# Patient Record
Sex: Male | Born: 1975 | Race: Black or African American | Hispanic: No | Marital: Single | Smoking: Never smoker
Health system: Southern US, Community
[De-identification: ages and names within clinical notes are randomized; demographics above are authoritative.]

---

## 2014-01-14 ENCOUNTER — Emergency Department (INDEPENDENT_AMBULATORY_CARE_PROVIDER_SITE_OTHER)
Admission: EM | Admit: 2014-01-14 | Discharge: 2014-01-14 | Disposition: A | Discharge status: Home / Self Care | Payer: Self-pay | Source: Home / Self Care

## 2014-01-14 ENCOUNTER — Encounter (HOSPITAL_COMMUNITY): Payer: Self-pay | Admitting: Emergency Medicine

## 2014-01-14 DIAGNOSIS — H663X2 Other chronic suppurative otitis media, left ear: Secondary | ICD-10-CM

## 2014-01-14 DIAGNOSIS — H663X9 Other chronic suppurative otitis media, unspecified ear: Secondary | ICD-10-CM

## 2014-01-14 DIAGNOSIS — H7290 Unspecified perforation of tympanic membrane, unspecified ear: Secondary | ICD-10-CM

## 2014-01-14 DIAGNOSIS — H7292 Unspecified perforation of tympanic membrane, left ear: Secondary | ICD-10-CM

## 2014-01-14 MED ORDER — CIPROFLOXACIN-DEXAMETHASONE 0.3-0.1 % OT SUSP: 4.0000 [drp] | Freq: Two times a day (BID) | OTIC | Status: DC

## 2014-01-14 NOTE — Discharge Instructions (Signed)
You have a perforated or open ear drum on the Left side.  THis will likely need surgical repair  Please go see the ENT specialists at     [address] [phone]  Pelase use the ear drops as needed for pain and infection  Consider using ibuprofen as well for pain and swelling.

## 2014-01-14 NOTE — ED Provider Notes (Signed)
CSN: [id number]     Arrival date & time 01/14/14  1749 History   None    Chief Complaint  Patient presents with  . Otalgia   (Consider location/radiation/quality/duration/timing/severity/associated sxs/prior Treatment) HPI  L earache. Ongoing off and on for 1 yr. Previously dx w/ swimmers ear w/ resolution w/ drops. Current episode started 2-3 wks ago. No change. Qtips w/o benefit. No discharge. No swimming. Echo in the L ear, but no loss of balance, HA, fevers, nausea.    History reviewed. No pertinent past medical history. History reviewed. No pertinent past surgical history. No family history on file. History  Substance Use Topics  . Smoking status: Never Smoker   . Smokeless tobacco: Not on file  . Alcohol Use: Not on file    Review of Systems Per HPI with all other pertinent systems negative.   Allergies  Review of patient's allergies indicates no known allergies.  Home Medications   Prior to Admission medications   Medication Sig Start Date End Date Taking? Authorizing Provider  ciprofloxacin-dexamethasone (CIPRODEX) otic suspension Place 4 drops into the left ear 2 (two) times daily. Treat for 14 dyas 01/14/14   Ozella Rocksavid J Vicy Medico, MD   BP 138/83  Pulse 60  Temp(Src) 98 F (36.7 C) (Oral)  Resp 18  SpO2 96% Physical Exam  Constitutional: He is oriented to person, place, and time. He appears well-developed and well-nourished. No distress.  HENT:  Head: Normocephalic.  R TM nml L TM w/ perforation along the posterior 1/3 of the eardrum w/ internal purulence and drainage present.   Eyes: EOM are normal. Pupils are equal, round, and reactive to light.  Neck: Normal range of motion. Neck supple.  Cardiovascular: Normal rate, normal heart sounds and intact distal pulses.  Exam reveals no gallop.   No murmur heard. Pulmonary/Chest: Effort normal and breath sounds normal. No respiratory distress.  Abdominal: Soft. He exhibits no distension.  Musculoskeletal: Normal  range of motion. He exhibits no edema and no tenderness.  Neurological: He is alert and oriented to person, place, and time.  Skin: Skin is warm.  Psychiatric: He has a normal mood and affect. His behavior is normal. Judgment and thought content normal.    ED Course  Procedures (including critical care time) Labs Review Labs Reviewed - No data to display  Imaging Review No results found.   MDM   1. Tympanic membrane perforation, left   2. 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 Possibly severe AOM (especially staph based), vs mechanical trauma, vs congenital (unlikely).   Ciprodex. Refills given in case they are needed prior to apt.  May need orals if not clearing Pt needs f/u w/ ENt for likely surgical repair. Referral placed and pt to call .  Shelly Flattenavid Deunte Bledsoe, MD Family Medicine 01/14/2014, 6:49 PM       Ozella Rocksavid J Rodert Hinch, MD 01/14/14 [phone]

## 2014-01-14 NOTE — ED Notes (Signed)
C/o left ear pain States feels as if ear is clogged up

## 2014-04-02 ENCOUNTER — Emergency Department (HOSPITAL_BASED_OUTPATIENT_CLINIC_OR_DEPARTMENT_OTHER)
Admission: EM | Admit: 2014-04-02 | Discharge: 2014-04-02 | Disposition: A | Discharge status: Home / Self Care | Payer: Self-pay | Attending: Emergency Medicine | Admitting: Emergency Medicine

## 2014-04-02 ENCOUNTER — Encounter (HOSPITAL_BASED_OUTPATIENT_CLINIC_OR_DEPARTMENT_OTHER): Payer: Self-pay | Admitting: Emergency Medicine

## 2014-04-02 DIAGNOSIS — Z7952 Long term (current) use of systemic steroids: Secondary | ICD-10-CM | POA: Insufficient documentation

## 2014-04-02 DIAGNOSIS — H109 Unspecified conjunctivitis: Secondary | ICD-10-CM | POA: Insufficient documentation

## 2014-04-02 MED ORDER — POLYMYXIN B-TRIMETHOPRIM 10000-0.1 UNIT/ML-% OP SOLN: 1.0000 [drp] | OPHTHALMIC | Status: DC

## 2014-04-02 MED ORDER — TETRACAINE HCL 0.5 % OP SOLN
OPHTHALMIC | Status: AC
  Filled 2014-04-02: qty 2

## 2014-04-02 MED ORDER — FLUORESCEIN SODIUM 1 MG OP STRP
ORAL_STRIP | OPHTHALMIC | Status: AC
  Filled 2014-04-02: qty 1

## 2014-04-02 MED ORDER — KETOROLAC TROMETHAMINE 0.4 % OP SOLN - NO CHARGE: 1.0000 [drp] | Freq: Four times a day (QID) | OPHTHALMIC | Status: DC | PRN

## 2014-04-02 NOTE — ED Notes (Addendum)
Pt reports possible eyes exposure to dust or chemicals at work. Pt presents with bilateral eye redness and irritation. No pertinent medical Hx. Pt denies blurred vision.

## 2014-04-02 NOTE — ED Provider Notes (Signed)
CSN: [id number]     Arrival date & time 04/02/14  1130 History   First MD Initiated Contact with Patient 04/02/14 1154     Chief Complaint  Patient presents with  . 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 Patient is not sure if this is possibly related to that. The warehouse is dusty. He does not recall any specific exposure to his eyes. Patient does not notice any change when he goes home for the day. Denies any other trouble with fever. No pain. No blurred vision. His eyes primarily red and irritated.  Patient is a 38 y.o. male presenting with eye pain. The history is provided by the patient.  Eye Pain This is a new problem. Episode onset: Several days ago. The problem occurs constantly. The problem has not changed since onset.Pertinent negatives include no chest pain, no abdominal pain, no headaches and no shortness of breath. Nothing relieves the symptoms.    History reviewed. No pertinent past medical history. History reviewed. No pertinent past surgical history. No family history on file. History  Substance Use Topics  . Smoking status: Never Smoker   . Smokeless tobacco: Not on file  . Alcohol Use: Not on file    Review of Systems  Eyes: Positive for pain.  Respiratory: Negative for shortness of breath.   Cardiovascular: Negative for chest pain.  Gastrointestinal: Negative for abdominal pain.  Neurological: Negative for headaches.      Allergies  Review of patient's allergies indicates no known allergies.  Home Medications   Prior to Admission medications   Medication Sig Start Date End Date Taking? Authorizing Provider  ciprofloxacin-dexamethasone (CIPRODEX) otic suspension Place 4 drops into the left ear 2 (two) times daily. Treat for 14 dyas 01/14/14   Ozella Rocksavid J Merrell, MD  ketorolac (ACULAR LS) 0.4 % SOLN Place 1 drop into both eyes 4 (four) times daily as needed (eye discomfort). 04/02/14    Linwood DibblesJon Fremon Zacharia, MD  trimethoprim-polymyxin b (POLYTRIM) ophthalmic solution Place 1 drop into both eyes every 4 (four) hours. 04/02/14   Linwood DibblesJon Ulrick Methot, MD   BP 126/92  Pulse 63  Temp(Src) 97.7 F (36.5 C) (Oral)  Resp 20  Ht 5\' 8"  (1.727 m)  Wt 160 lb (72.576 kg)  BMI 24.33 kg/m2  SpO2 100% Physical Exam  ED Course  Procedures (including critical care time)   MDM   Final diagnoses:  Bilateral conjunctivitis    Doubt Allergic conjunctivitis. Could be viral, less likely bacterial.  Rx acular and polytrim.   Follow up if not improving    Linwood DibblesJon Tee Richeson, MD 04/02/14 1209

## 2014-04-02 NOTE — Discharge Instructions (Signed)

## 2016-05-04 ENCOUNTER — Ambulatory Visit (HOSPITAL_COMMUNITY)
Admission: EM | Admit: 2016-05-04 | Discharge: 2016-05-04 | Disposition: A | Discharge status: Home / Self Care | Payer: Self-pay | Attending: Family Medicine | Admitting: Family Medicine

## 2016-05-04 ENCOUNTER — Encounter (HOSPITAL_COMMUNITY): Payer: Self-pay | Admitting: Family Medicine

## 2016-05-04 DIAGNOSIS — Z79899 Other long term (current) drug therapy: Secondary | ICD-10-CM | POA: Insufficient documentation

## 2016-05-04 DIAGNOSIS — Z202 Contact with and (suspected) exposure to infections with a predominantly sexual mode of transmission: Secondary | ICD-10-CM

## 2016-05-04 MED ORDER — METRONIDAZOLE 500 MG PO TABS: 2000.0000 mg | ORAL_TABLET | Freq: Once | ORAL | 0 refills | Status: AC

## 2016-05-04 NOTE — ED Provider Notes (Signed)
  [facility]    CSN: [id number] Arrival date & time: 05/04/16  1035     History   Chief Complaint Chief Complaint  Patient presents with  . Exposure to STD    HPI Cephus Slaterntoine Kallstrom is a 40 y.o. male.   This 40 year old male is here for STD evaluation.  He's had no symptoms but his girlfriend tested positive for trichomonas.   Patient works delivering plumbing supplies.      History reviewed. No pertinent past medical history.  There are no active problems to display for this patient.   History reviewed. No pertinent surgical history.     Home Medications    Prior to Admission medications   Medication Sig Start Date End Date Taking? Authorizing Provider  metroNIDAZOLE (FLAGYL) 500 MG tablet Take 4 tablets (2,000 mg total) by mouth once. 05/04/16 05/04/16  Elvina SidleKurt Tyriq Moragne, MD    Family History History reviewed. No pertinent family history.  Social History Social History  Substance Use Topics  . Smoking status: Never Smoker  . Smokeless tobacco: Never Used  . Alcohol use Not on file     Allergies   Patient has no known allergies.   Review of Systems Review of Systems  Constitutional: Negative.   HENT: Negative.   Respiratory: Negative.   Gastrointestinal: Negative.   Genitourinary: Negative.   Musculoskeletal: Negative.      Physical Exam Triage Vital Signs ED Triage Vitals [05/04/16 1104]  Enc Vitals Group     BP 131/73     Pulse Rate (!) 54     Resp 16     Temp 98 F (36.7 C)     Temp src      SpO2 99 %     Weight      Height      Head Circumference      Peak Flow      Pain Score      Pain Loc      Pain Edu?      Excl. in GC?    No data found.   Updated Vital Signs BP 131/73   Pulse (!) 54   Temp 98 F (36.7 C)   Resp 16   SpO2 99%       Physical Exam  Constitutional: He appears well-developed and well-nourished.  HENT:  Head: Normocephalic.  Genitourinary: Penis normal.  Skin: Skin is warm and dry.    Nursing note and vitals reviewed.    UC Treatments / Results  Labs (all labs ordered are listed, but only abnormal results are displayed) Labs Reviewed  URINE CYTOLOGY ANCILLARY ONLY    EKG  EKG Interpretation None       Radiology No results found.  Procedures Procedures (including critical care time)  Medications Ordered in UC Medications - No data to display   Initial Impression / Assessment and Plan / UC Course  I have reviewed the triage vital signs and the nursing notes.  Pertinent labs & imaging results that were available during my care of the patient were reviewed by me and considered in my medical decision making (see chart for details).  Clinical Course     Final Clinical Impressions(s) / UC Diagnoses   Final diagnoses:  STD exposure    New Prescriptions New Prescriptions   METRONIDAZOLE (FLAGYL) 500 MG TABLET    Take 4 tablets (2,000 mg total) by mouth once.     Elvina SidleKurt Saphia Vanderford, MD 05/04/16 1209

## 2016-05-04 NOTE — ED Triage Notes (Signed)
Pt here for possible STI. sts that his girlfriend told him she had trichomonas. Denies any symptoms.

## 2016-05-04 NOTE — Discharge Instructions (Signed)
Final lab results should be back by Friday.

## 2016-05-05 LAB — URINE CYTOLOGY ANCILLARY ONLY
Chlamydia: NEGATIVE
Neisseria Gonorrhea: NEGATIVE
Trichomonas: NEGATIVE

## 2017-07-08 ENCOUNTER — Encounter (HOSPITAL_COMMUNITY): Payer: Self-pay | Admitting: *Deleted

## 2017-07-08 ENCOUNTER — Emergency Department (HOSPITAL_COMMUNITY): Payer: 59

## 2017-07-08 ENCOUNTER — Other Ambulatory Visit: Payer: Self-pay

## 2017-07-08 DIAGNOSIS — Z79899 Other long term (current) drug therapy: Secondary | ICD-10-CM | POA: Diagnosis not present

## 2017-07-08 DIAGNOSIS — R079 Chest pain, unspecified: Secondary | ICD-10-CM | POA: Diagnosis present

## 2017-07-08 DIAGNOSIS — R072 Precordial pain: Secondary | ICD-10-CM | POA: Insufficient documentation

## 2017-07-08 LAB — CBC
HCT: 41.2 % (ref 39.0–52.0)
Hemoglobin: 13.8 g/dL (ref 13.0–17.0)
MCH: 29.8 pg (ref 26.0–34.0)
MCHC: 33.5 g/dL (ref 30.0–36.0)
MCV: 89 fL (ref 78.0–100.0)
PLATELETS: 269 10*3/uL (ref 150–400)
RBC: 4.63 MIL/uL (ref 4.22–5.81)
RDW: 13.4 % (ref 11.5–15.5)
WBC: 8.8 10*3/uL (ref 4.0–10.5)

## 2017-07-08 LAB — BASIC METABOLIC PANEL
Anion gap: 10 (ref 5–15)
BUN: 17 mg/dL (ref 6–20)
CALCIUM: 9.3 mg/dL (ref 8.9–10.3)
CO2: 26 mmol/L (ref 22–32)
Chloride: 101 mmol/L (ref 101–111)
Creatinine, Ser: 1.52 mg/dL — ABNORMAL HIGH (ref 0.61–1.24)
GFR calc Af Amer: >60 mL/min (ref >60)
GFR, EST NON AFRICAN AMERICAN: 55 mL/min — AB (ref >60)
GLUCOSE: 106 mg/dL — AB (ref 65–99)
Potassium: 3.6 mmol/L (ref 3.5–5.1)
SODIUM: 137 mmol/L (ref 135–145)

## 2017-07-08 LAB — I-STAT TROPONIN, ED: TROPONIN I, POC: 0.01 ng/mL (ref 0.00–0.08)

## 2017-07-08 NOTE — ED Triage Notes (Signed)
Pt c/o chest tightness for the past two days, reports increased sob when sitting still. Had cold like symptoms with productive cough for the past several days, has been taking mucinex without relief. Also reports lifting weights, but not more than usual

## 2017-07-09 ENCOUNTER — Emergency Department (HOSPITAL_COMMUNITY)
Admission: EM | Admit: 2017-07-09 | Discharge: 2017-07-09 | Disposition: A | Discharge status: Home / Self Care | Payer: 59 | Attending: Emergency Medicine | Admitting: Emergency Medicine

## 2017-07-09 DIAGNOSIS — R072 Precordial pain: Secondary | ICD-10-CM

## 2017-07-09 MED ORDER — IBUPROFEN 600 MG PO TABS: 600.0000 mg | ORAL_TABLET | Freq: Three times a day (TID) | ORAL | 0 refills | Status: DC

## 2017-07-09 MED ORDER — IBUPROFEN 800 MG PO TABS
800.0000 mg | ORAL_TABLET | Freq: Once | ORAL | Status: AC
  Administered 2017-07-09: 800 mg via ORAL
  Filled 2017-07-09: qty 1

## 2017-07-09 NOTE — Discharge Instructions (Signed)

## 2017-07-09 NOTE — ED Provider Notes (Signed)
[facility] EMERGENCY DEPARTMENT Provider Note   CSN: [id number] Arrival date & time: 07/08/17  2228     History   Chief Complaint Chief Complaint  Patient presents with  . Chest Pain    HPI John Barry is a 42 y.o. male.  The history is provided by the patient.  Chest Pain   This is a new problem. The current episode started more than 2 days ago. The problem occurs constantly. The problem has been gradually worsening. Associated with: Certain positions. The pain is present in the substernal region. The pain is moderate. The quality of the pain is described as pressure-like. The pain does not radiate. The symptoms are aggravated by certain positions. Associated symptoms include cough and shortness of breath. Pertinent negatives include no abdominal pain, no back pain, no dizziness, no fever, no hemoptysis, no lower extremity edema, no near-syncope, no syncope and no vomiting. Treatments tried: Mucinex. There are no known risk factors.  Pertinent negatives for past medical history include no CAD and no PE.   Patient reports gradual onset of chest pressure about a week ago It seems to be worse with lying flat, improves with sitting up No GERD symptoms reported No fever/vomiting He reports minimal shortness of breath. He reports recent cough and/head cold, has been taking Mucinex He reports he does work out, but reports his pain actually improves with exercise His chest pain is not exertional There is no pleuritic pain    PMH-none Soc hx - nonsmoker, no drug use Home Medications    Prior to Admission medications   Medication Sig Start Date End Date Taking? Authorizing Provider  guaiFENesin (MUCINEX) 600 MG 12 hr tablet Take 600 mg by mouth 2 (two) times daily as needed for cough or to loosen phlegm.   Yes [provider]    Family History No family history on file.  Social History Social History   Tobacco Use  . Smoking status: Never  Smoker  . Smokeless tobacco: Never Used  Substance Use Topics  . Alcohol use: Yes    Frequency: Never  . Drug use: No     Allergies   Patient has no known allergies.   Review of Systems Review of Systems  Constitutional: Negative for fever.  Respiratory: Positive for cough and shortness of breath. Negative for hemoptysis.   Cardiovascular: Positive for chest pain. Negative for leg swelling, syncope and near-syncope.  Gastrointestinal: Negative for abdominal pain and vomiting.  Musculoskeletal: Negative for back pain.  Neurological: Negative for dizziness and syncope.  All other systems reviewed and are negative.    Physical Exam Updated Vital Signs BP (!) 156/88   Pulse (!) 59   Temp 98.1 F (36.7 C)   Resp 16   SpO2 99%   Physical Exam CONSTITUTIONAL: Well developed/well nourished HEAD: Normocephalic/atraumatic EYES: EOMI/PERRL ENMT: Mucous membranes moist NECK: supple no meningeal signs SPINE/BACK:entire spine nontender CV: S1/S2 noted, no murmurs/rubs/gallops noted LUNGS: Lungs are clear to auscultation bilaterally, no apparent distress Chest -no tenderness noted to chest wall  ABDOMEN: soft, nontender, no rebound or guarding, bowel sounds noted throughout abdomen GU:no cva tenderness NEURO: Pt is awake/alert/appropriate, moves all extremitiesx4.  No facial droop.   EXTREMITIES: pulses normal/equal, full ROM, no lower extremity edema or tenderness SKIN: warm, color normal PSYCH: no abnormalities of mood noted, alert and oriented to situation   ED Treatments / Results  Labs (all labs ordered are listed, but only abnormal results are displayed) Labs Reviewed  BASIC  METABOLIC PANEL - Abnormal; Notable for the following components:      Result Value   Glucose, Bld 106 (*)    Creatinine, Ser 1.52 (*)    GFR calc non Af Amer 55 (*)    All other components within normal limits  CBC  I-STAT TROPONIN, ED    EKG  EKG Interpretation  Date/Time:  Saturday  July 08 2017 22:33:03 EST Ventricular Rate:  71 PR Interval:  142 QRS Duration: 94 QT Interval:  410 QTC Calculation: 445 R Axis:   92 Text Interpretation:  Normal sinus rhythm Rightward axis Borderline ECG No previous ECGs available Confirmed by Zadie RhineWickline, Cesilia Shinn ([id number]) on 07/09/2017 12:48:40 AM       Radiology Dg Chest 2 View  Result Date: 07/08/2017 CLINICAL DATA:  Chest pain for 2 days. EXAM: CHEST  2 VIEW COMPARISON:  None. FINDINGS: The cardiac silhouette, mediastinal and hilar contours are normal. The lungs are clear. No pleural effusion. The bony thorax is intact. IMPRESSION: No acute cardiopulmonary findings and intact bony thorax. Electronically Signed   By: Rudie MeyerP.  Gallerani M.D.   On: 07/08/2017 23:12    Procedures Procedures    Medications Ordered in ED Medications  ibuprofen (ADVIL,MOTRIN) tablet 800 mg (800 mg Oral Given 07/09/17 0133)     Initial Impression / Assessment and Plan / ED Course  I have reviewed the triage vital signs and the nursing notes.  Pertinent labs & imaging results that were available during my care of the patient were reviewed by me and considered in my medical decision making (see chart for details).     2:05 AM Patient with chest pain for the past several days.  It only seems to be worse when he is lying back but improved with sitting forward it is not pleuritic or sharp, is not exertional He appears to be PERC negative Troponin negative, my suspicion for ACS is low Pericarditis is a possibility, though no convincing signs on EKG I feel it is reasonable to discharge home, ibuprofen 3 times daily for a week, increase p.o. fluids as he is dehydrated, and follow-up with cardiology if persist after a week.  Also advised need to limit exertion over the next week until seen by cardiology or his symptoms are resolved  Final Clinical Impressions(s) / ED Diagnoses   Final diagnoses:  Precordial pain    ED Discharge Orders        Ordered     ibuprofen (ADVIL,MOTRIN) 600 MG tablet  3 times daily     07/09/17 0147       Zadie RhineWickline, Amaliya Whitelaw, MD 07/09/17 0206

## 2017-07-14 ENCOUNTER — Encounter: Payer: Self-pay | Admitting: Physician Assistant

## 2017-07-14 ENCOUNTER — Ambulatory Visit: Payer: 59 | Admitting: Physician Assistant

## 2017-07-14 ENCOUNTER — Other Ambulatory Visit: Payer: Self-pay

## 2017-07-14 VITALS — BP 138/89 | HR 56 | Temp 98.3°F | Resp 16 | Ht 68.0 in | Wt 179.0 lb

## 2017-07-14 DIAGNOSIS — R0789 Other chest pain: Secondary | ICD-10-CM

## 2017-07-14 DIAGNOSIS — J320 Chronic maxillary sinusitis: Secondary | ICD-10-CM | POA: Diagnosis not present

## 2017-07-14 MED ORDER — AMOXICILLIN-POT CLAVULANATE 875-125 MG PO TABS: 1.0000 | ORAL_TABLET | Freq: Two times a day (BID) | ORAL | 0 refills | Status: DC

## 2017-07-14 NOTE — Patient Instructions (Addendum)
I am treating you today for a sinus infection. However, your symptoms could be due to reflux. If you are not better after your antibiotics, come back and we will treat you for reflux.   Consider sleeping with a humidifier in your room. Try a neti pot or nasal saline spray to rinse your nasal passages.  Try elevating the head of your bed, or sleep on a few pillows. See if this helps.  We can also consider anxiety. Please see below for tips. There are options for anxiety treatment, however it is always great to have stress relieving techniques like deep breathing, stretching, etc.    Come back and see me in 3-4 weeks to recheck your symptoms.   NUTRITION Eat more plants: colorful vegetables, nuts, seeds and berries.  Eat less sugar, salt, preservatives and processed foods.  Avoid toxins such as cigarettes and alcohol.  Drink water when you are thirsty. Warm water with a slice of lemon is an excellent morning drink to start the day.  Consider these websites for more information The Nutrition Source (https://www.henry-hernandez.biz/) Precision Nutrition (WindowBlog.ch)   RELAXATION Consider practicing mindfulness meditation or other relaxation techniques such as deep breathing, prayer, yoga, tai chi, massage. See website mindful.org or the apps Headspace or Calm to help get started.   SLEEP Try to get at least 7-8+ hours sleep per day. Regular exercise and reduced caffeine will help you sleep better. Practice good sleep hygeine techniques. See website sleep.org for more information.   PLANNING Prepare estate planning, living will, healthcare POA documents. Sometimes this is best planned with the help of an attorney. Theconversationproject.org and agingwithdignity.org are excellent resources.  Thank you for coming in today. I hope you feel we met your needs.  Feel free to call PCP if you have any questions or further requests.  Please  consider signing up for MyChart if you do not already have it, as this is a great way to communicate with me.  Best,  Whitney McVey, PA-C  IF you received an x-ray today, you will receive an invoice from [facility]. Please contact [facility] at [phone] with questions or concerns regarding your invoice.   IF you received labwork today, you will receive an invoice from [place]. Please contact [facility] at [phone] with questions or concerns regarding your invoice.   Our billing staff will not be able to assist you with questions regarding bills from these companies.  You will be contacted with the lab results as soon as they are available. The fastest way to get your results is to activate your My Chart account. Instructions are located on the last page of this paperwork. If you have not heard from [place] regarding the results in 2 weeks, please contact this office.

## 2017-07-14 NOTE — Progress Notes (Signed)
John Barry  MRN: [id number] DOB: 09/22/1975  PCP: Patient, No Pcp Per  Subjective:  Pt is a 42 year old male who presents to clinic for cough x 2 weeks.  Endorses feeling of chest tightness when he lays down.   Seen in [place] 07/09/2017 for the same c/c. Normal sinus rhythm Rightward axis. Negative cardiac workup. Treated with ibuprofen for possible pericarditis.  Coughs occasionally during the day. Worse at night. Denies symptoms of reflux.  Mucinex x 1 week - not helping.  He was taking zantac twice daily. Helped a little bit. Theraflu - did not help.  H/o allergies.   Of note, pt's sister was shot and killed 2 years ago. He admits to sadness and anxiety over this. Denies SI or HI.    Review of Systems  Constitutional: Negative for chills and diaphoresis.  HENT: Positive for congestion, rhinorrhea, sinus pressure and sinus pain.   Respiratory: Positive for cough. Negative for chest tightness, shortness of breath and wheezing.   Cardiovascular: Negative for chest pain, palpitations and leg swelling.  Gastrointestinal: Negative for diarrhea, nausea and vomiting.  Musculoskeletal: Negative for neck pain.  Neurological: Negative for dizziness, syncope, light-headedness and headaches.  Psychiatric/Behavioral: Positive for sleep disturbance. The patient is not nervous/anxious.     There are no active problems to display for this patient.   Current Outpatient Medications on File Prior to Visit  Medication Sig Dispense Refill  . ibuprofen (ADVIL,MOTRIN) 600 MG tablet Take 1 tablet (600 mg total) by mouth 3 (three) times daily. 21 tablet 0  . guaiFENesin (MUCINEX) 600 MG 12 hr tablet Take 600 mg by mouth 2 (two) times daily as needed for cough or to loosen phlegm.     No current facility-administered medications on file prior to visit.     No Known Allergies   Objective:  BP 138/89   Pulse (!) 56   Temp 98.3 F (36.8 C) (Oral)   Resp 16   Ht 5\' 8"  (1.727 m)   Wt 179  lb (81.2 kg)   SpO2 96%   BMI 27.22 kg/m   Physical Exam  Constitutional: He is oriented to person, place, and time and well-developed, well-nourished, and in no distress. No distress.  HENT:  Right Ear: Tympanic membrane normal.  Left Ear: Tympanic membrane normal.  Nose: Mucosal edema present. No rhinorrhea. Right sinus exhibits frontal sinus tenderness. Right sinus exhibits no maxillary sinus tenderness. Left sinus exhibits frontal sinus tenderness. Left sinus exhibits no maxillary sinus tenderness.  Mouth/Throat: Oropharynx is clear and moist and mucous membranes are normal.  Cardiovascular: Normal rate, regular rhythm and normal heart sounds.  Pulmonary/Chest: Effort normal and breath sounds normal. No respiratory distress. He has no wheezes. He has no rales.  Neurological: He is alert and oriented to person, place, and time. GCS score is 15.  Skin: Skin is warm and dry.  Psychiatric: Mood, memory, affect and judgment normal.  Vitals reviewed.   07/08/2017 - Chest x-ray negative.  Assessment and Plan :  1. Maxillary sinusitis, unspecified chronicity - amoxicillin-clavulanate (AUGMENTIN) 875-125 MG tablet; Take 1 tablet by mouth 2 (two) times daily.  Dispense: 14 tablet; Refill: 0  2. Chest tightness - Check Peak Flow - H. pylori breath test - Pt c/o cough and chest tightness > 2 weeks. Suspect sinusitis vs anxiety vs GERD. Peak flow today is 440 "I felt like phlegm was trying to come up when I blew". Advised nasal rinses, humidifier in his room at night.  Encouraged pt to consider his anxiety surrounding his sisters' death two years ago. RTC in 3-4 weeks to recheck symptoms. Consider GERD treatment.   Marco CollieWhitney Burnham Trost, PA-C  [facility] at [facility] 07/14/2017 3:47 PM

## 2017-07-15 LAB — H. PYLORI BREATH TEST: H pylori Breath Test: NEGATIVE

## 2017-07-17 ENCOUNTER — Encounter: Payer: Self-pay | Admitting: Physician Assistant

## 2017-08-04 ENCOUNTER — Ambulatory Visit: Payer: 59 | Admitting: Physician Assistant

## 2017-08-04 ENCOUNTER — Encounter: Payer: Self-pay | Admitting: Physician Assistant

## 2017-08-04 ENCOUNTER — Other Ambulatory Visit: Payer: Self-pay

## 2017-08-04 ENCOUNTER — Ambulatory Visit (INDEPENDENT_AMBULATORY_CARE_PROVIDER_SITE_OTHER): Payer: 59 | Admitting: Physician Assistant

## 2017-08-04 VITALS — BP 110/86 | HR 57 | Temp 98.2°F | Resp 16 | Ht 67.0 in | Wt 174.0 lb

## 2017-08-04 DIAGNOSIS — R0981 Nasal congestion: Secondary | ICD-10-CM

## 2017-08-04 DIAGNOSIS — K219 Gastro-esophageal reflux disease without esophagitis: Secondary | ICD-10-CM

## 2017-08-04 DIAGNOSIS — R0789 Other chest pain: Secondary | ICD-10-CM

## 2017-08-04 MED ORDER — PANTOPRAZOLE SODIUM 20 MG PO TBEC: 20.0000 mg | DELAYED_RELEASE_TABLET | Freq: Every day | ORAL | 1 refills | Status: AC

## 2017-08-04 MED ORDER — FLUTICASONE PROPIONATE 50 MCG/ACT NA SUSP: 2.0000 | Freq: Every day | NASAL | 6 refills | Status: AC

## 2017-08-04 MED ORDER — OMEPRAZOLE 20 MG PO CPDR: 20.0000 mg | DELAYED_RELEASE_CAPSULE | Freq: Every day | ORAL | 1 refills | Status: DC

## 2017-08-04 NOTE — Patient Instructions (Addendum)
Throw your Afrin in the trash!! Do not use any nasal spray for 5-7 days.  Start using flonse: 2 sprays each nostril daily as needed for nasal congestion  Prilosec is for reflux. Take this EVERY MORNING 30 minutes before the first meal of the day.  You can elevate the head of your bed 6 inches to alleviate nighttime symptoms.  Come back and see me in 3-4 weeks.   Thank you for coming in today. I hope you feel we met your needs.  Feel free to call PCP if you have any questions or further requests.  Please consider signing up for MyChart if you do not already have it, as this is a great way to communicate with me.  Best,  Whitney McVey, PA-C   Food Choices for Gastroesophageal Reflux Disease, Adult When you have gastroesophageal reflux disease (GERD), the foods you eat and your eating habits are very important. Choosing the right foods can help ease your discomfort. What guidelines do I need to follow?  Choose fruits, vegetables, whole grains, and low-fat dairy products.  Choose low-fat meat, fish, and poultry.  Limit fats such as oils, salad dressings, butter, nuts, and avocado.  Keep a food diary. This helps you identify foods that cause symptoms.  Avoid foods that cause symptoms. These may be different for everyone.  Eat small meals often instead of 3 large meals a day.  Eat your meals slowly, in a place where you are relaxed.  Limit fried foods.  Cook foods using methods other than frying.  Avoid drinking alcohol.  Avoid drinking large amounts of liquids with your meals.  Avoid bending over or lying down until 2-3 hours after eating. What foods are not recommended? These are some foods and drinks that may make your symptoms worse: Vegetables Tomatoes. Tomato juice. Tomato and spaghetti sauce. Chili peppers. Onion and garlic. Horseradish. Fruits Oranges, grapefruit, and lemon (fruit and juice). Meats High-fat meats, fish, and poultry. This includes hot dogs, ribs,  ham, sausage, salami, and bacon. Dairy Whole milk and chocolate milk. Sour cream. Cream. Butter. Ice cream. Cream cheese. Drinks Coffee and tea. Bubbly (carbonated) drinks or energy drinks. Condiments Hot sauce. Barbecue sauce. Sweets/Desserts Chocolate and cocoa. Donuts. Peppermint and spearmint. Fats and Oils High-fat foods. This includes [place] fries and potato chips. Other Vinegar. Strong spices. This includes black pepper, white pepper, red pepper, cayenne, curry powder, cloves, ginger, and chili powder. The items listed above may not be a complete list of foods and drinks to avoid. Contact your dietitian for more information. This information is not intended to replace advice given to you by your health care provider. Make sure you discuss any questions you have with your health care provider. Document Released: 12/13/2011 Document Revised: 11/19/2015 Document Reviewed: 04/17/2013 Elsevier Interactive Patient Education  2017 [employer].  IF you received an x-ray today, you will receive an invoice from [facility]. Please contact [facility] at [phone] with questions or concerns regarding your invoice.   IF you received labwork today, you will receive an invoice from [place]. Please contact [facility] at [phone] with questions or concerns regarding your invoice.   Our billing staff will not be able to assist you with questions regarding bills from these companies.  You will be contacted with the lab results as soon as they are available. The fastest way to get your results is to activate your My Chart account. Instructions are located on the last page of this paperwork. If you have not heard  from [place] regarding the results in 2 weeks, please contact this office.

## 2017-08-04 NOTE — Progress Notes (Signed)
   John Barry  MRN: [id number] DOB: Jul 20, 1975  PCP: Patient, No Pcp Per  Subjective:  Pt is a pleasant 42 year old male who presents to clinic for f/u chest tightness x three weeks.  "feels like mucus is there".  He uses Afrin on a regular basis for nasal congestion.  Coughs occasionally during the day. Worse at night. Denies symptoms of reflux.  Mucinex x 1 week - not helping.  He was taking zantac twice daily. Helped a little bit. He was treated for sinusitis on 1/18 with augmentin - this did not help his symptoms very much. At that OV "Suspect sinusitis vs anxiety vs GERD."  Of note, pt's sister was shot and killed 2 years ago. He admits to sadness and anxiety over this. Denies SI or HI.   Review of Systems  Constitutional: Negative for chills, diaphoresis and fatigue.  HENT: Positive for congestion.   Respiratory: Positive for cough and chest tightness. Negative for shortness of breath and wheezing.   Cardiovascular: Negative for chest pain, palpitations and leg swelling.    There are no active problems to display for this patient.   No current outpatient medications on file prior to visit.   No current facility-administered medications on file prior to visit.     No Known Allergies   Objective:  BP 110/86   Pulse (!) 57   Temp 98.2 F (36.8 C) (Oral)   Resp 16   Ht 5\' 7"  (1.702 m)   Wt 174 lb (78.9 kg)   SpO2 98%   BMI 27.25 kg/m   Physical Exam  Constitutional: He is oriented to person, place, and time and well-developed, well-nourished, and in no distress. No distress.  Cardiovascular: Normal rate, regular rhythm and normal heart sounds.  Pulmonary/Chest: Effort normal and breath sounds normal. No respiratory distress. He has no wheezes.  Neurological: He is alert and oriented to person, place, and time. GCS score is 15.  Skin: Skin is warm and dry.  Psychiatric: Mood, memory, affect and judgment normal.  Vitals reviewed.    Assessment and Plan :    1. Gastroesophageal reflux disease, esophagitis presence not specified 2. Atypical chest pain - pantoprazole (PROTONIX) 20 MG tablet; Take 1 tablet (20 mg total) by mouth daily.  Dispense: 30 tablet; Refill: 1 - pt presents with con't chest congestion and tightness. He was treated for sinusitis 1/18 - this did not help symptoms. Negative H Pylori. Plan to treat for GERD at this time. Advised raising head of bed 6". RTC in 4 weeks for recheck. Recheck kidney function at next OV.  3. Nasal congestion - fluticasone (FLONASE) 50 MCG/ACT nasal spray; Place 2 sprays into both nostrils daily.  Dispense: 16 g; Refill: 6 - advised pt to d/c Afrin use.   John CollieWhitney Hilja Kintzel, PA-C  Primary Care at [facility] 08/04/2017 6:22 PM

## 2017-08-23 ENCOUNTER — Ambulatory Visit: Payer: 59 | Admitting: Physician Assistant

## 2018-09-04 ENCOUNTER — Emergency Department (HOSPITAL_BASED_OUTPATIENT_CLINIC_OR_DEPARTMENT_OTHER): Payer: 59

## 2018-09-04 ENCOUNTER — Emergency Department (HOSPITAL_BASED_OUTPATIENT_CLINIC_OR_DEPARTMENT_OTHER)
Admission: EM | Admit: 2018-09-04 | Discharge: 2018-09-04 | Disposition: A | Discharge status: Home / Self Care | Payer: 59 | Attending: Emergency Medicine | Admitting: Emergency Medicine

## 2018-09-04 ENCOUNTER — Encounter (HOSPITAL_BASED_OUTPATIENT_CLINIC_OR_DEPARTMENT_OTHER): Payer: Self-pay | Admitting: *Deleted

## 2018-09-04 ENCOUNTER — Other Ambulatory Visit: Payer: Self-pay

## 2018-09-04 DIAGNOSIS — R109 Unspecified abdominal pain: Secondary | ICD-10-CM

## 2018-09-04 DIAGNOSIS — R509 Fever, unspecified: Secondary | ICD-10-CM | POA: Insufficient documentation

## 2018-09-04 DIAGNOSIS — N200 Calculus of kidney: Secondary | ICD-10-CM | POA: Insufficient documentation

## 2018-09-04 DIAGNOSIS — R112 Nausea with vomiting, unspecified: Secondary | ICD-10-CM | POA: Insufficient documentation

## 2018-09-04 DIAGNOSIS — Z79899 Other long term (current) drug therapy: Secondary | ICD-10-CM | POA: Insufficient documentation

## 2018-09-04 LAB — URINALYSIS, MICROSCOPIC (REFLEX): WBC UA: NONE SEEN WBC/hpf (ref 0–5)

## 2018-09-04 LAB — URINALYSIS, ROUTINE W REFLEX MICROSCOPIC
BILIRUBIN URINE: NEGATIVE
Glucose, UA: NEGATIVE mg/dL
Ketones, ur: NEGATIVE mg/dL
Leukocytes,Ua: NEGATIVE
NITRITE: NEGATIVE
PH: 6.5 (ref 5.0–8.0)
Protein, ur: 30 mg/dL — AB
SPECIFIC GRAVITY, URINE: 1.02 (ref 1.005–1.030)

## 2018-09-04 LAB — CBC WITH DIFFERENTIAL/PLATELET
Abs Immature Granulocytes: 0.06 10*3/uL (ref 0.00–0.07)
Basophils Absolute: 0 10*3/uL (ref 0.0–0.1)
Basophils Relative: 0 %
Eosinophils Absolute: 0.1 10*3/uL (ref 0.0–0.5)
Eosinophils Relative: 0 %
HEMATOCRIT: 44.6 % (ref 39.0–52.0)
Hemoglobin: 14.2 g/dL (ref 13.0–17.0)
IMMATURE GRANULOCYTES: 0 %
LYMPHS ABS: 2.3 10*3/uL (ref 0.7–4.0)
LYMPHS PCT: 14 %
MCH: 29.2 pg (ref 26.0–34.0)
MCHC: 31.8 g/dL (ref 30.0–36.0)
MCV: 91.8 fL (ref 80.0–100.0)
Monocytes Absolute: 1.8 10*3/uL — ABNORMAL HIGH (ref 0.1–1.0)
Monocytes Relative: 11 %
Neutro Abs: 11.8 10*3/uL — ABNORMAL HIGH (ref 1.7–7.7)
Neutrophils Relative %: 75 %
Platelets: 241 10*3/uL (ref 150–400)
RBC: 4.86 MIL/uL (ref 4.22–5.81)
RDW: 12.9 % (ref 11.5–15.5)
WBC: 16.1 10*3/uL — ABNORMAL HIGH (ref 4.0–10.5)
nRBC: 0 % (ref 0.0–0.2)

## 2018-09-04 LAB — COMPREHENSIVE METABOLIC PANEL
ALBUMIN: 4 g/dL (ref 3.5–5.0)
ALT: 18 U/L (ref 0–44)
ANION GAP: 7 (ref 5–15)
AST: 19 U/L (ref 15–41)
Alkaline Phosphatase: 138 U/L — ABNORMAL HIGH (ref 38–126)
BUN: 19 mg/dL (ref 6–20)
CHLORIDE: 101 mmol/L (ref 98–111)
CO2: 26 mmol/L (ref 22–32)
Calcium: 9.2 mg/dL (ref 8.9–10.3)
Creatinine, Ser: 2.25 mg/dL — ABNORMAL HIGH (ref 0.61–1.24)
GFR calc Af Amer: 40 mL/min — ABNORMAL LOW (ref >60)
GFR calc non Af Amer: 34 mL/min — ABNORMAL LOW (ref >60)
GLUCOSE: 107 mg/dL — AB (ref 70–99)
Potassium: 4 mmol/L (ref 3.5–5.1)
Sodium: 134 mmol/L — ABNORMAL LOW (ref 135–145)
Total Bilirubin: 0.8 mg/dL (ref 0.3–1.2)
Total Protein: 8 g/dL (ref 6.5–8.1)

## 2018-09-04 LAB — LIPASE, BLOOD: Lipase: 61 U/L — ABNORMAL HIGH (ref 11–51)

## 2018-09-04 MED ORDER — ONDANSETRON 4 MG PO TBDP: 4.0000 mg | ORAL_TABLET | Freq: Three times a day (TID) | ORAL | 0 refills | Status: AC | PRN

## 2018-09-04 MED ORDER — SODIUM CHLORIDE 0.9 % IV SOLN
8.0000 mg | Freq: Once | INTRAVENOUS | Status: DC
  Filled 2018-09-04: qty 4

## 2018-09-04 MED ORDER — ONDANSETRON HCL 4 MG/2ML IJ SOLN
4.0000 mg | Freq: Once | INTRAMUSCULAR | Status: AC
  Administered 2018-09-04: 4 mg via INTRAVENOUS
  Filled 2018-09-04: qty 2

## 2018-09-04 MED ORDER — SODIUM CHLORIDE 0.9 % IV BOLUS
1000.0000 mL | Freq: Once | INTRAVENOUS | Status: AC
  Administered 2018-09-04: 1000 mL via INTRAVENOUS

## 2018-09-04 MED ORDER — OXYCODONE-ACETAMINOPHEN 5-325 MG PO TABS: 2.0000 | ORAL_TABLET | Freq: Four times a day (QID) | ORAL | 0 refills | Status: AC | PRN

## 2018-09-04 MED ORDER — TAMSULOSIN HCL 0.4 MG PO CAPS: 0.4000 mg | ORAL_CAPSULE | Freq: Every day | ORAL | 0 refills | Status: AC

## 2018-09-04 MED ORDER — ONDANSETRON HCL 4 MG/2ML IJ SOLN
INTRAMUSCULAR | Status: AC
  Administered 2018-09-04: 8 mg
  Filled 2018-09-04: qty 4

## 2018-09-04 MED ORDER — MORPHINE SULFATE (PF) 4 MG/ML IV SOLN
4.0000 mg | Freq: Once | INTRAVENOUS | Status: AC
  Administered 2018-09-04: 4 mg via INTRAVENOUS
  Filled 2018-09-04: qty 1

## 2018-09-04 NOTE — ED Provider Notes (Signed)
[facility] EMERGENCY DEPARTMENT Provider Note   CSN: [id number] Arrival date & time: 09/04/18  1143    History   Chief Complaint Chief Complaint  Patient presents with  . Abdominal Pain    HPI John Barry is a 43 y.o. male presenting to emergency department today with chief complaint of right sided flank pain x2 days.  He describes the pain as sharp.  Patient reports associated nausea and vomiting.  He estimates 4 episodes of nonbloody nonbilious emesis.  Patient also had fever last night with T-max of 101.4.  Patient took Tylenol this morning and reports minimal relief.  He states the pain radiates to his groin. Denies cough, chest pain, shortness of breath, hematuria, diarrhea. Patient's abdominal surgical history includes appendectomy. Also denies history of kidney stones.  History provided by patient.     History reviewed. No pertinent past medical history.  There are no active problems to display for this patient.   History reviewed. No pertinent surgical history.      Home Medications    Prior to Admission medications   Medication Sig Start Date End Date Taking? Authorizing Provider  fluticasone (FLONASE) 50 MCG/ACT nasal spray Place 2 sprays into both nostrils daily. 08/04/17   McVey, Madelaine Bhat, PA-C  ondansetron (ZOFRAN ODT) 4 MG disintegrating tablet Take 1 tablet (4 mg total) by mouth every 8 (eight) hours as needed for nausea or vomiting. 09/04/18   , Caroleen Hamman, PA-C  oxyCODONE-acetaminophen (PERCOCET/ROXICET) 5-325 MG tablet Take 2 tablets by mouth every 6 (six) hours as needed for severe pain. 09/04/18   ,  E, PA-C  pantoprazole (PROTONIX) 20 MG tablet Take 1 tablet (20 mg total) by mouth daily. 08/04/17   McVey, Madelaine Bhat, PA-C  tamsulosin (FLOMAX) 0.4 MG CAPS capsule Take 1 capsule (0.4 mg total) by mouth daily for 7 days. 09/04/18 09/11/18  , Caroleen Hamman, PA-C    Family History No family history on  file.  Social History Social History   Tobacco Use  . Smoking status: Never Smoker  . Smokeless tobacco: Never Used  Substance Use Topics  . Alcohol use: Yes    Frequency: Never  . Drug use: No     Allergies   Patient has no known allergies.   Review of Systems Review of Systems  Constitutional: Positive for chills and fever.  HENT: Negative for congestion, ear pain, sinus pressure, sinus pain and sore throat.   Eyes: Negative for pain and redness.  Respiratory: Negative for cough and shortness of breath.   Cardiovascular: Negative for chest pain and palpitations.  Gastrointestinal: Positive for abdominal pain, nausea and vomiting. Negative for diarrhea and rectal pain.  Genitourinary: Positive for flank pain. Negative for discharge, dysuria, hematuria, penile pain and testicular pain.     Physical Exam Updated Vital Signs BP 127/85 (BP Location: Left Arm)   Pulse 77   Temp 98.1 F (36.7 C) (Oral)   Resp 20   Ht 5\' 8"  (1.727 m)   Wt 77.1 kg   SpO2 99%   BMI 25.85 kg/m   Physical Exam Vitals signs and nursing note reviewed.  Constitutional:      Appearance: He is well-developed. He is not toxic-appearing.  HENT:     Head: Normocephalic and atraumatic.     Mouth/Throat:     Mouth: Mucous membranes are moist.     Pharynx: Oropharynx is clear.  Eyes:     General: No scleral icterus.       Right  eye: No discharge.        Left eye: No discharge.     Extraocular Movements: Extraocular movements intact.     Conjunctiva/sclera: Conjunctivae normal.     Pupils: Pupils are equal, round, and reactive to light.  Neck:     Musculoskeletal: Normal range of motion.  Cardiovascular:     Rate and Rhythm: Normal rate and regular rhythm.     Pulses: Normal pulses.     Heart sounds: Normal heart sounds.  Pulmonary:     Effort: Pulmonary effort is normal.     Breath sounds: Normal breath sounds.  Abdominal:     General: Bowel sounds are normal. There is no distension.      Palpations: Abdomen is soft.     Tenderness: There is generalized abdominal tenderness. There is right CVA tenderness. There is no left CVA tenderness.  Musculoskeletal: Normal range of motion.  Skin:    General: Skin is warm and dry.  Neurological:     Mental Status: He is oriented to person, place, and time.     Comments: Fluent speech, no facial droop.  Psychiatric:        Behavior: Behavior normal.      ED Treatments / Results  Labs (all labs ordered are listed, but only abnormal results are displayed) Labs Reviewed  COMPREHENSIVE METABOLIC PANEL - Abnormal; Notable for the following components:      Result Value   Sodium 134 (*)    Glucose, Bld 107 (*)    Creatinine, Ser 2.25 (*)    Alkaline Phosphatase 138 (*)    GFR calc non Af Amer 34 (*)    GFR calc Af Amer 40 (*)    All other components within normal limits  LIPASE, BLOOD - Abnormal; Notable for the following components:   Lipase 61 (*)    All other components within normal limits  CBC WITH DIFFERENTIAL/PLATELET - Abnormal; Notable for the following components:   WBC 16.1 (*)    Neutro Abs 11.8 (*)    Monocytes Absolute 1.8 (*)    All other components within normal limits  URINALYSIS, ROUTINE W REFLEX MICROSCOPIC - Abnormal; Notable for the following components:   Hgb urine dipstick SMALL (*)    Protein, ur 30 (*)    All other components within normal limits  URINALYSIS, MICROSCOPIC (REFLEX) - Abnormal; Notable for the following components:   Bacteria, UA RARE (*)    All other components within normal limits    EKG None  Radiology Ct Renal Stone Study  Result Date: 09/04/2018 CLINICAL DATA:  Acute right-sided abdominal pain. EXAM: CT ABDOMEN AND PELVIS WITHOUT CONTRAST TECHNIQUE: Multidetector CT imaging of the abdomen and pelvis was performed following the standard protocol without IV contrast. COMPARISON:  None. FINDINGS: Lower chest: No acute abnormality. Hepatobiliary: No focal liver abnormality is  seen. No gallstones, gallbladder wall thickening, or biliary dilatation. Pancreas: Unremarkable. No pancreatic ductal dilatation or surrounding inflammatory changes. Spleen: Normal in size without focal abnormality. Adrenals/Urinary Tract: Adrenal glands appear normal. Left kidney and ureter are unremarkable. Moderate right hydroureteronephrosis is noted secondary to 4 mm calculus at right ureterovesical junction. Urinary bladder is otherwise unremarkable. Stomach/Bowel: Stomach is within normal limits. Appendix appears normal. No evidence of bowel wall thickening, distention, or inflammatory changes. Vascular/Lymphatic: No significant vascular findings are present. No enlarged abdominal or pelvic lymph nodes. Reproductive: Prostate is unremarkable. Other: No abdominal wall hernia or abnormality. No abdominopelvic ascites. Musculoskeletal: No acute or significant osseous findings. IMPRESSION:  Moderate right hydroureteronephrosis secondary to 4 mm calculus at right ureterovesical junction. Electronically Signed   By: Lupita Raider, M.D.   On: 09/04/2018 16:24    Procedures Procedures (including critical care time)  Medications Ordered in ED Medications  ondansetron (ZOFRAN) 8 mg in sodium chloride 0.9 % 50 mL IVPB (8 mg Intravenous Not Given 09/04/18 1711)  ondansetron (ZOFRAN) injection 4 mg (4 mg Intravenous Given 09/04/18 1417)  sodium chloride 0.9 % bolus 1,000 mL (0 mLs Intravenous Stopped 09/04/18 1534)  sodium chloride 0.9 % bolus 1,000 mL (0 mLs Intravenous Stopped 09/04/18 1745)  morphine 4 MG/ML injection 4 mg (4 mg Intravenous Given 09/04/18 1714)  ondansetron (ZOFRAN) 4 MG/2ML injection (8 mg  Given 09/04/18 1710)     Initial Impression / Assessment and Plan / ED Course  I have reviewed the triage vital signs and the nursing notes.  Pertinent labs & imaging results that were available during my care of the patient were reviewed by me and considered in my medical decision making (see chart  for details).    Pt has been diagnosed with a Kidney Stone via CT. There is no evidence of significant hydronephrosis. CMP shows creatine elevated to 2.25.  C with leukocytosis of 16.1.  UA with small hemoglobin.  Vitals sign stable and the pt does not have irratractable vomiting.  Pain and nausea improved after morphine and nausea.  On repeat exam abdomen is benign. Pt will be dc home with pain medications & has been advised to follow up with PCP in 1 week to have creatinine rechecked.   This note was prepared with assistance of Conservation officer, historic buildings. Occasional wrong-word or sound-a-like substitutions may have occurred due to the inherent limitations of voice recognition software.   Final Clinical Impressions(s) / ED Diagnoses   Final diagnoses:  Kidney stone  Flank pain    ED Discharge Orders         Ordered    tamsulosin (FLOMAX) 0.4 MG CAPS capsule  Daily     09/04/18 1756    ondansetron (ZOFRAN ODT) 4 MG disintegrating tablet  Every 8 hours PRN     09/04/18 1756    oxyCODONE-acetaminophen (PERCOCET/ROXICET) 5-325 MG tablet  Every 6 hours PRN     09/04/18 1756           Sherene Sires, PA-C 09/04/18 1800    Terrilee Files, MD 09/04/18 Rickey Primus

## 2018-09-04 NOTE — Discharge Instructions (Addendum)
Please follow-up with primary care provider in 1 week to have blood work rechecked.  Your creatinine today was 2.25.  You should increase fluid intake to help improve this.   You were seen in the emergency department and found to have a kidney stone.  We are sending you home with multiple medications to assist with passing the stone:   -Flomax-this is a medication to help pass the stone, it allows urine to exit the body more freely.  Please take this once daily with a meal.  -Ibuprofen 800 mg-this is a medication that will help with pain as well as passing the stone.  Please take this every 8 hours.  Take this with food as it can cause stomach upset and at worst stomach bleeding.  Do not take other NSAIDs such as Motrin, Aleve, Advil, Mobic, or Naproxen with this medicine as they are similar and would propagate any potential side effects.   -Percocet-this is a narcotic/controlled substance medication that has potential addicting qualities.  We recommend that you take 1-2 tablets every 6 hours as needed for severe pain.  Do not drive or operate heavy machinery when taking this medicine as it can be sedating. Do not drink alcohol or take other sedating medications when taking this medicine for safety reasons.  Keep this out of reach of small children.  Please be aware this medicine has Tylenol in it (325 mg/tab) do not exceed the maximum dose of Tylenol in a day per over the counter recommendations should you decide to supplement with Tylenol over the counter.   -Zofran-this is an antinausea medication, you may take this every 8 hours as needed for nausea and vomiting, please allow the tablet to dissolve underneath of your tongue.   We have prescribed you new medication(s) today. Discuss the medications prescribed today with your pharmacist as they can have adverse effects and interactions with your other medicines including over the counter and prescribed medications. Seek medical evaluation if you start  to experience new or abnormal symptoms after taking one of these medicines, seek care immediately if you start to experience difficulty breathing, feeling of your throat closing, facial swelling, or rash as these could be indications of a more serious allergic reaction   Return to the ER for new or worsening symptoms including but not limited to worsening pain not controlled by these medicines, inability to keep fluids down, fever, or any other concerns that you may have.

## 2018-09-04 NOTE — ED Triage Notes (Signed)
Abdominal pain right upper quad x 2 days. Fever since last night. Tylenol 1000mg  this am.

## 2018-09-04 NOTE — ED Notes (Signed)
Patient verbalizes understanding of discharge instructions. Opportunity for questioning and answers were provided. Armband removed by staff, pt discharged from ED ambulatory.   

## 2019-08-24 IMAGING — CR DG CHEST 2V
2 series · 2 of 2 positions shown · non-contrast
Comparison: None.

CLINICAL DATA: Chest pain for 2 days.

EXAM:
CHEST  2 VIEW

[chest pa]
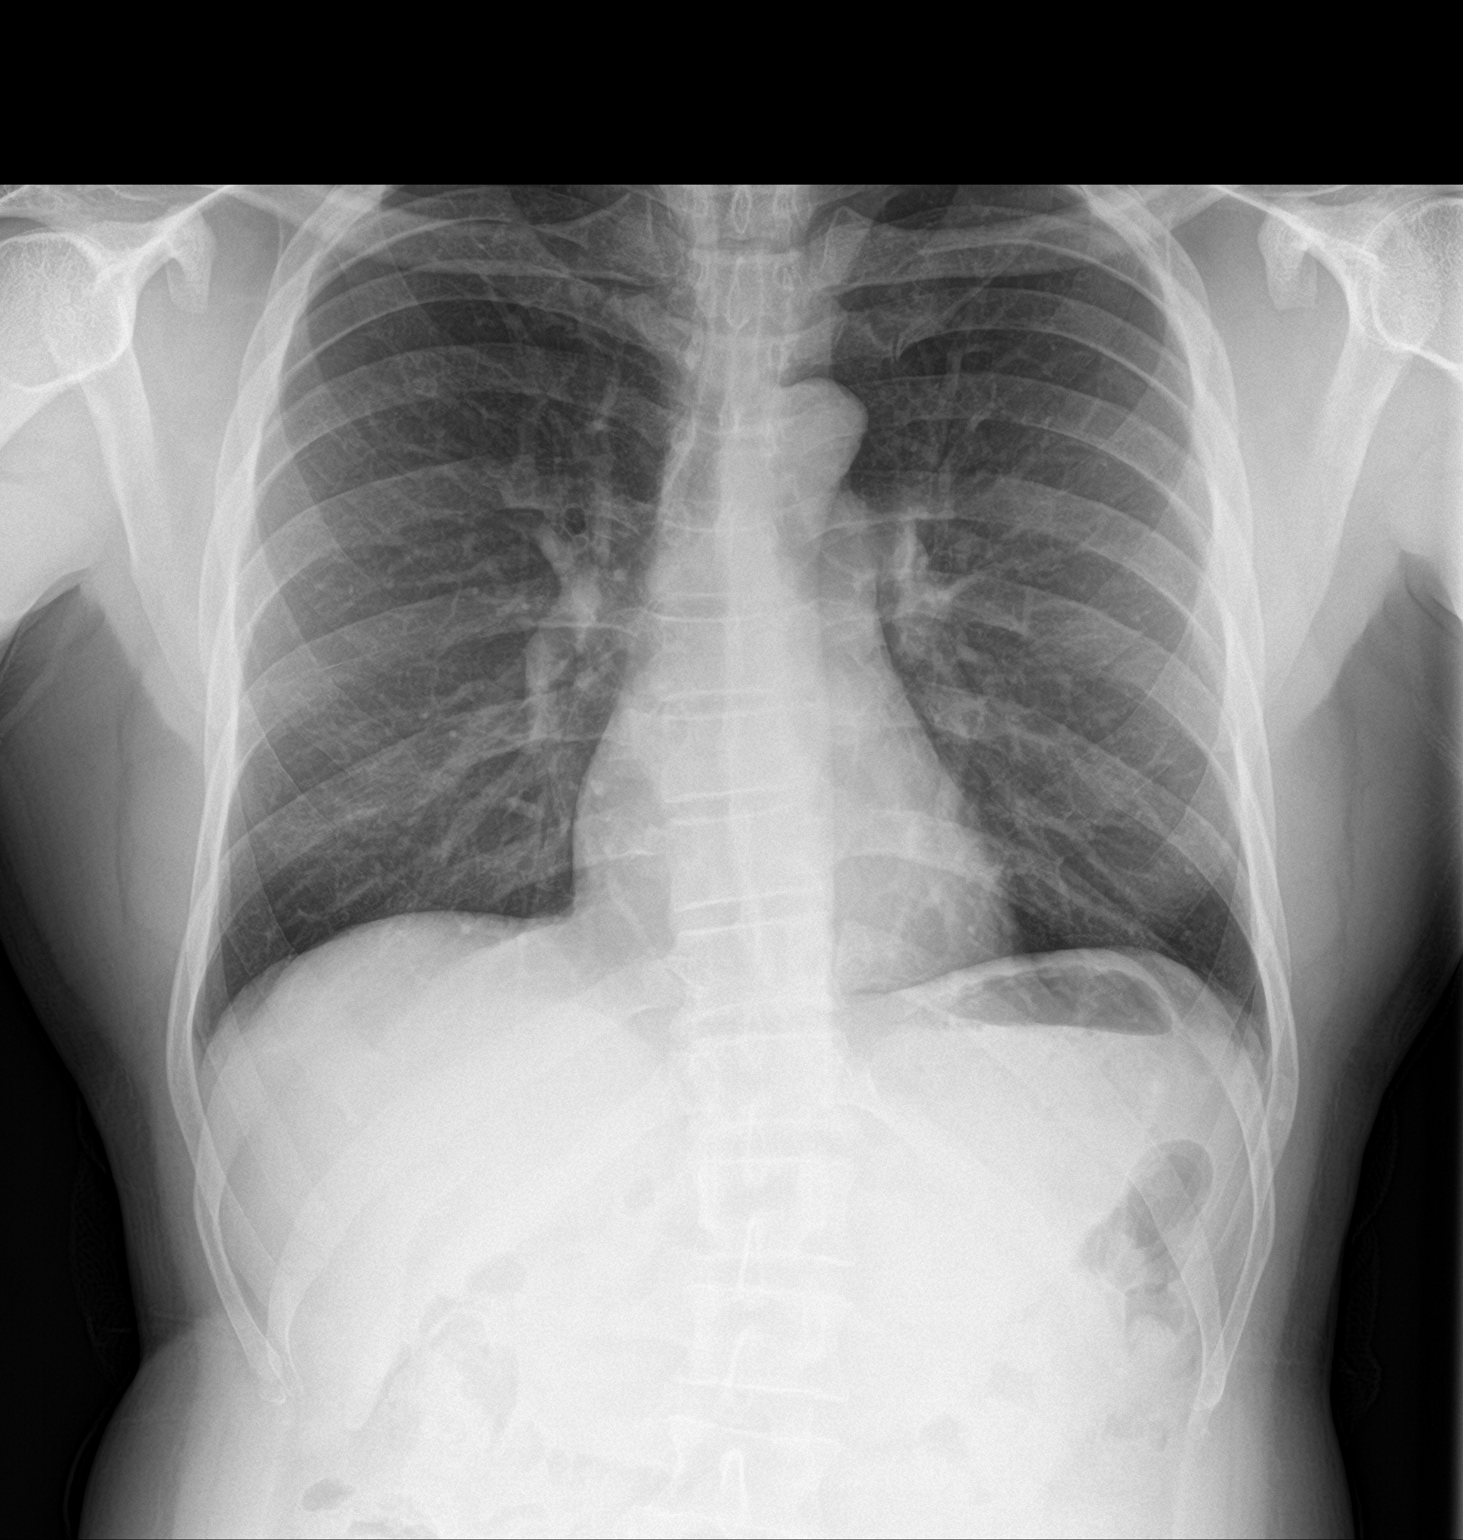

[chest lat]
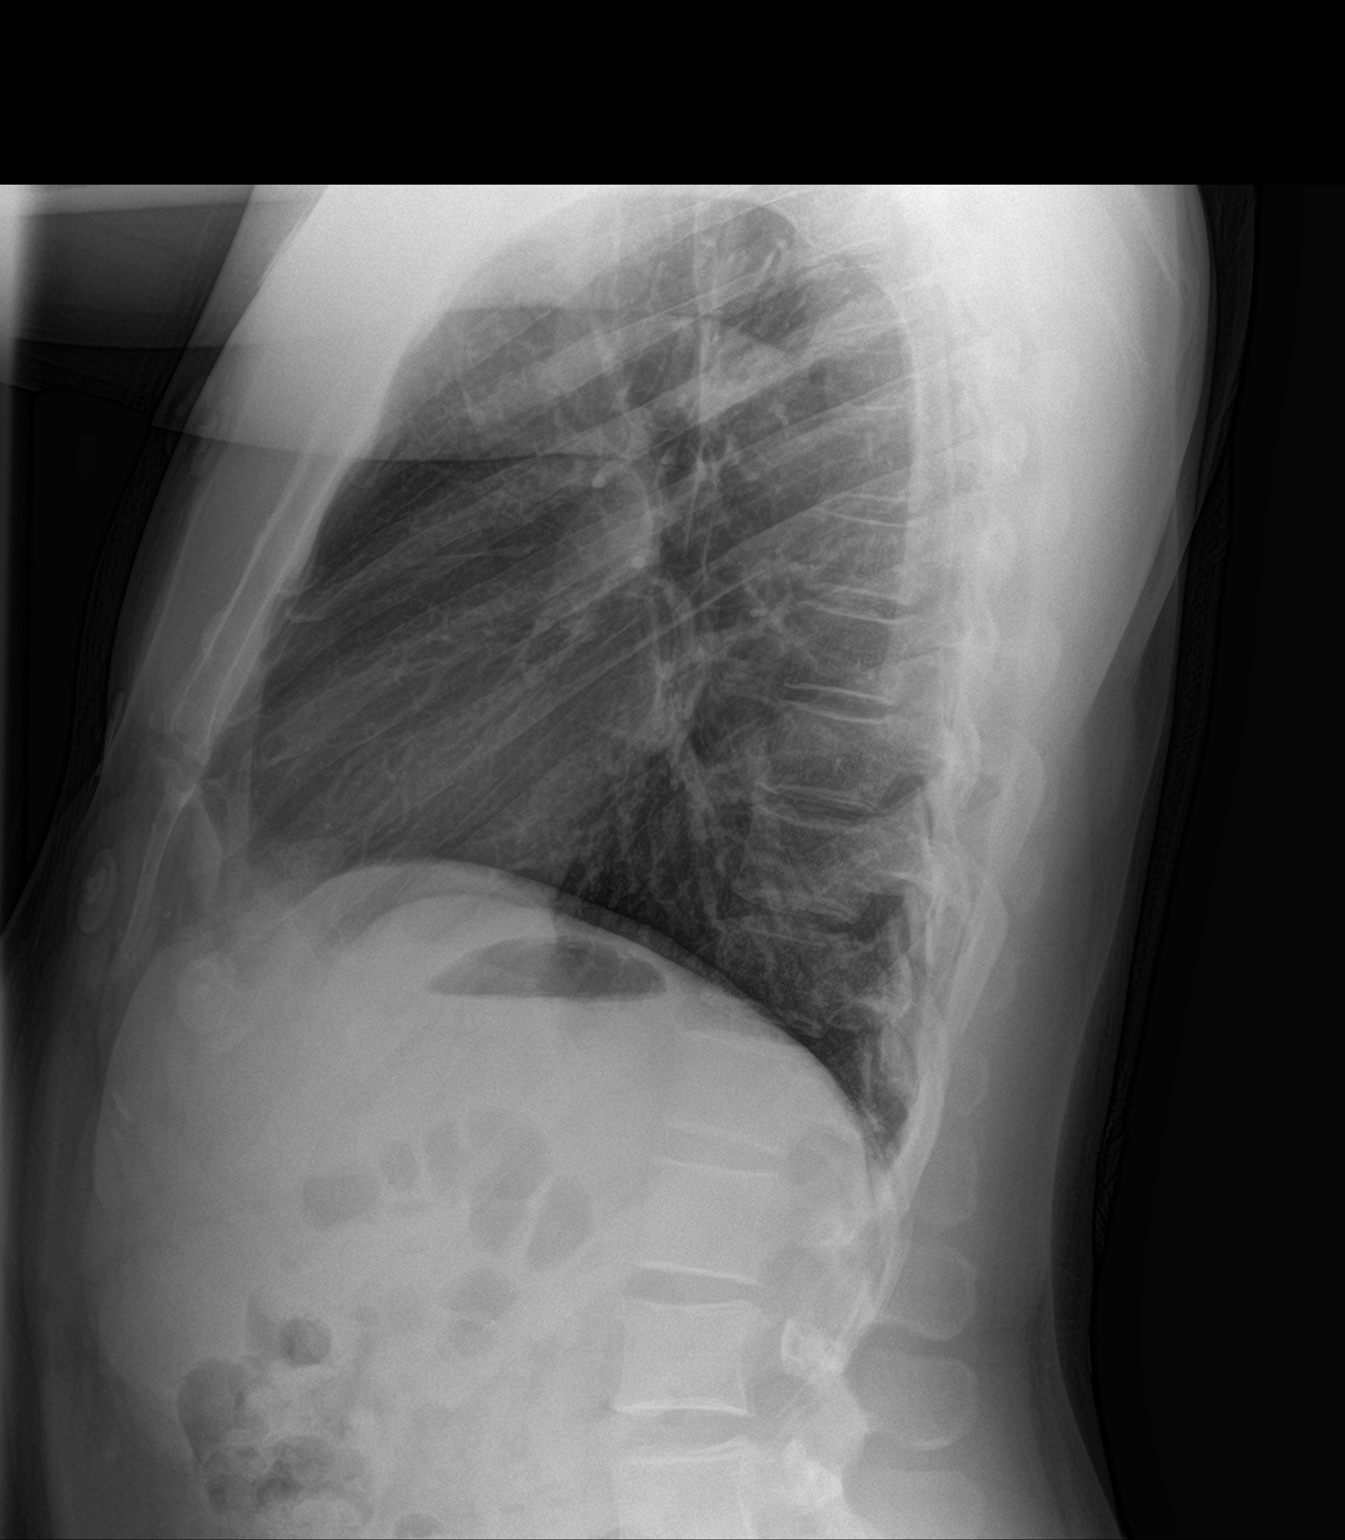

[2 of 2 positions shown; findings below may reference images not displayed]

FINDINGS: The cardiac silhouette, mediastinal and hilar contours are normal.
The lungs are clear. No pleural effusion. The bony thorax is intact.
IMPRESSION: No acute cardiopulmonary findings and intact bony thorax.
